# Patient Record
Sex: Male | Born: 1985 | Race: Black or African American | Hispanic: No | Marital: Married | Smoking: Never smoker
Health system: Southern US, Community
[De-identification: ages and names within clinical notes are randomized; demographics above are authoritative.]

---

## 2019-01-09 ENCOUNTER — Other Ambulatory Visit: Payer: Self-pay | Admitting: *Deleted

## 2019-01-09 DIAGNOSIS — Z20822 Contact with and (suspected) exposure to covid-19: Secondary | ICD-10-CM

## 2019-01-10 LAB — NOVEL CORONAVIRUS, NAA: SARS-CoV-2, NAA: NOT DETECTED

## 2019-01-11 ENCOUNTER — Telehealth: Payer: Self-pay | Admitting: General Practice

## 2019-01-11 NOTE — Telephone Encounter (Signed)
Negative COVID results given. Patient results "NOT Detected." Caller expressed understanding. ° °

## 2019-05-22 ENCOUNTER — Other Ambulatory Visit: Payer: Self-pay

## 2019-05-22 ENCOUNTER — Ambulatory Visit: Payer: Self-pay | Attending: Internal Medicine

## 2019-05-22 DIAGNOSIS — Z20822 Contact with and (suspected) exposure to covid-19: Secondary | ICD-10-CM | POA: Insufficient documentation

## 2019-05-23 LAB — NOVEL CORONAVIRUS, NAA: SARS-CoV-2, NAA: NOT DETECTED

## 2019-07-07 ENCOUNTER — Encounter (HOSPITAL_BASED_OUTPATIENT_CLINIC_OR_DEPARTMENT_OTHER): Payer: Self-pay | Admitting: Emergency Medicine

## 2019-07-07 ENCOUNTER — Emergency Department (HOSPITAL_BASED_OUTPATIENT_CLINIC_OR_DEPARTMENT_OTHER): Payer: No Typology Code available for payment source

## 2019-07-07 ENCOUNTER — Other Ambulatory Visit: Payer: Self-pay

## 2019-07-07 ENCOUNTER — Emergency Department (HOSPITAL_BASED_OUTPATIENT_CLINIC_OR_DEPARTMENT_OTHER)
Admission: EM | Admit: 2019-07-07 | Discharge: 2019-07-07 | Disposition: A | Discharge status: Home / Self Care | Payer: No Typology Code available for payment source | Attending: Emergency Medicine | Admitting: Emergency Medicine

## 2019-07-07 DIAGNOSIS — R0781 Pleurodynia: Secondary | ICD-10-CM | POA: Diagnosis not present

## 2019-07-07 DIAGNOSIS — Y998 Other external cause status: Secondary | ICD-10-CM | POA: Insufficient documentation

## 2019-07-07 DIAGNOSIS — Y9389 Activity, other specified: Secondary | ICD-10-CM | POA: Insufficient documentation

## 2019-07-07 DIAGNOSIS — Y92410 Unspecified street and highway as the place of occurrence of the external cause: Secondary | ICD-10-CM | POA: Diagnosis not present

## 2019-07-07 DIAGNOSIS — M25511 Pain in right shoulder: Secondary | ICD-10-CM | POA: Insufficient documentation

## 2019-07-07 MED ORDER — METHOCARBAMOL 500 MG PO TABS: 500.0000 mg | ORAL_TABLET | Freq: Two times a day (BID) | ORAL | 0 refills | Status: DC

## 2019-07-07 MED ORDER — LIDOCAINE 5 % EX PTCH: 1.0000 | MEDICATED_PATCH | CUTANEOUS | 0 refills | Status: DC

## 2019-07-07 NOTE — ED Notes (Signed)
PA at bedside.

## 2019-07-07 NOTE — ED Notes (Signed)
Wife at bedside.
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
## 2019-07-07 NOTE — Discharge Instructions (Signed)
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
## 2019-12-30 ENCOUNTER — Ambulatory Visit
Admission: RE | Admit: 2019-12-30 | Discharge: 2019-12-30 | Disposition: A | Discharge status: Home / Self Care | Payer: Self-pay | Source: Ambulatory Visit | Attending: Emergency Medicine | Admitting: Emergency Medicine

## 2019-12-30 ENCOUNTER — Other Ambulatory Visit: Payer: Self-pay

## 2019-12-30 VITALS — BP 121/84 | HR 73 | Temp 98.1°F | Resp 18

## 2019-12-30 DIAGNOSIS — J029 Acute pharyngitis, unspecified: Secondary | ICD-10-CM | POA: Insufficient documentation

## 2019-12-30 DIAGNOSIS — Z20822 Contact with and (suspected) exposure to covid-19: Secondary | ICD-10-CM | POA: Insufficient documentation

## 2019-12-30 DIAGNOSIS — Z20818 Contact with and (suspected) exposure to other bacterial communicable diseases: Secondary | ICD-10-CM | POA: Insufficient documentation

## 2019-12-30 DIAGNOSIS — Z1152 Encounter for screening for COVID-19: Secondary | ICD-10-CM | POA: Diagnosis not present

## 2019-12-30 LAB — POCT RAPID STREP A (OFFICE): Rapid Strep A Screen: NEGATIVE

## 2019-12-30 MED ORDER — AMOXICILLIN 500 MG PO CAPS: 500.0000 mg | ORAL_CAPSULE | Freq: Two times a day (BID) | ORAL | 0 refills | Status: AC

## 2019-12-30 NOTE — Discharge Instructions (Signed)
Strep test negative Culture sent.  We will follow up with you regarding abnormal results COVID testing ordered.  It will take between 5-7 days for test results.  Someone will contact you regarding abnormal results.    In the meantime: You should remain isolated in your home for 10 days from symptom onset AND greater than 72 hours after symptoms resolution (absence of fever without the use of fever-reducing medication and improvement in respiratory symptoms), whichever is longer Get plenty of rest and push fluids Use OTC zyrtec for nasal congestion, runny nose, and/or sore throat Use OTC flonase for nasal congestion and runny nose Use medications daily for symptom relief Use OTC medications like ibuprofen or tylenol as needed fever or pain Call or go to the ED if you have any new or worsening symptoms such as fever, cough, shortness of breath, chest tightness, chest pain, turning blue, changes in mental status, etc..Marland Kitchen

## 2019-12-30 NOTE — ED Triage Notes (Signed)
Pt presents with scratchy throat for past few days , daughter both being treated for strep

## 2019-12-30 NOTE — ED Provider Notes (Signed)
[facility]   [id number] 12/30/19 Arrival Time: 1048   CC: sore throat; strep exposure  SUBJECTIVE: History from: patient.  Glenn Graham is a 34 y.o. male who presents with scratchy throat x few days.  Daughter currently being treated for strep.  Has tried warm tea with relief.  Symptoms are made worse with swallowing, but tolerating liquids and own secretions without difficulty.  Reports previous symptoms in the past.   Denies fever, chills, fatigue, sinus pain, rhinorrhea, cough, SOB, wheezing, chest pain, nausea, changes in bowel or bladder habits.     ROS: As per HPI.  All other pertinent ROS negative.     History reviewed. No pertinent past medical history. History reviewed. No pertinent surgical history. No Known Allergies No current facility-administered medications on file prior to encounter.   Current Outpatient Medications on File Prior to Encounter  Medication Sig Dispense Refill  . lidocaine (LIDODERM) 5 % Place 1 patch onto the skin daily. Remove & Discard patch within 12 hours or as directed by MD 30 patch 0   Social History   Socioeconomic History  . Marital status: Married    Spouse name: Not on file  . Number of children: Not on file  . Years of education: Not on file  . Highest education level: Not on file  Occupational History  . Not on file  Tobacco Use  . Smoking status: Never Smoker  . Smokeless tobacco: Never Used  Substance and Sexual Activity  . Alcohol use: Not Currently  . Drug use: Not Currently  . Sexual activity: Not on file  Other Topics Concern  . Not on file  Social History Narrative  . Not on file   Social Determinants of Health   Financial Resource Strain:   . Difficulty of Paying Living Expenses: Not on file  Food Insecurity:   . Worried About Programme researcher, broadcasting/film/video in the Last Year: Not on file  . Ran Out of Food in the Last Year: Not on file  Transportation Needs:   . Lack of Transportation (Medical): Not on file    . Lack of Transportation (Non-Medical): Not on file  Physical Activity:   . Days of Exercise per Week: Not on file  . Minutes of Exercise per Session: Not on file  Stress:   . Feeling of Stress : Not on file  Social Connections:   . Frequency of Communication with Friends and Family: Not on file  . Frequency of Social Gatherings with Friends and Family: Not on file  . Attends Religious Services: Not on file  . Active Member of Clubs or Organizations: Not on file  . Attends Banker Meetings: Not on file  . Marital Status: Not on file  Intimate Partner Violence:   . Fear of Current or Ex-Partner: Not on file  . Emotionally Abused: Not on file  . Physically Abused: Not on file  . Sexually Abused: Not on file   Family History  Family history unknown: Yes    OBJECTIVE:  Vitals:   12/30/19 1114  BP: 121/84  Pulse: 73  Resp: 18  Temp: 98.1 F (36.7 C)  SpO2: 96%     General appearance: alert; well-appearing, nontoxic; speaking in full sentences and tolerating own secretions HEENT: NCAT; Ears: EACs clear, TMs pearly gray; Eyes: PERRL.  EOM grossly intact.Nose: nares patent without rhinorrhea, Throat: oropharynx clear, tonsils non erythematous or enlarged, uvula midline  Neck: supple without LAD Lungs: unlabored respirations, symmetrical air entry; cough:  absent; no respiratory distress; CTAB Heart: regular rate and rhythm.  Skin: warm and dry Psychological: alert and cooperative; normal mood and affect   LABS:  Results for orders placed or performed during the hospital encounter of 12/30/19 (from the past 24 hour(s))  POCT rapid strep A     Status: None   Collection Time: 12/30/19 11:20 AM  Result Value Ref Range   Rapid Strep A Screen Negative Negative     ASSESSMENT & PLAN:  1. Encounter for screening for COVID-19   2. Sore throat   3. Suspected COVID-19 virus infection     Meds ordered this encounter  Medications  . amoxicillin (AMOXIL) 500 MG  capsule    Sig: Take 1 capsule (500 mg total) by mouth 2 (two) times daily for 10 days.    Dispense:  20 capsule    Refill:  0    Order Specific Question:   Supervising Provider    Answer:   Eustace Moore [[id number]]   Strep test negative Culture sent.  We will follow up with you regarding abnormal results COVID testing ordered.  It will take between 5-7 days for test results.  Someone will contact you regarding abnormal results.    In the meantime: You should remain isolated in your home for 10 days from symptom onset AND greater than 72 hours after symptoms resolution (absence of fever without the use of fever-reducing medication and improvement in respiratory symptoms), whichever is longer Get plenty of rest and push fluids Use OTC zyrtec for nasal congestion, runny nose, and/or sore throat Use OTC flonase for nasal congestion and runny nose Use medications daily for symptom relief Use OTC medications like ibuprofen or tylenol as needed fever or pain Call or go to the ED if you have any new or worsening symptoms such as fever, cough, shortness of breath, chest tightness, chest pain, turning blue, changes in mental status, etc...   Patient requests antibiotic due to daughters having strep  Reviewed expectations re: course of current medical issues. Questions answered. Outlined signs and symptoms indicating need for more acute intervention. Patient verbalized understanding. After Visit Summary given.         Rennis Harding, PA-C 12/30/19 1143

## 2020-01-01 LAB — SARS-COV-2, NAA 2 DAY TAT

## 2020-01-01 LAB — NOVEL CORONAVIRUS, NAA: SARS-CoV-2, NAA: NOT DETECTED

## 2020-01-02 LAB — CULTURE, GROUP A STREP (THRC)

## 2020-07-22 ENCOUNTER — Emergency Department: Admit: 2020-07-22 | Disposition: A | Discharge status: Still In Hospital | Payer: Self-pay

## 2020-07-22 ENCOUNTER — Other Ambulatory Visit: Payer: Self-pay

## 2020-07-22 ENCOUNTER — Emergency Department (INDEPENDENT_AMBULATORY_CARE_PROVIDER_SITE_OTHER)
Admission: EM | Admit: 2020-07-22 | Discharge: 2020-07-22 | Disposition: A | Discharge status: Home / Self Care | Payer: Self-pay | Source: Home / Self Care | Attending: Internal Medicine | Admitting: Internal Medicine

## 2020-07-22 ENCOUNTER — Encounter: Payer: Self-pay | Admitting: Emergency Medicine

## 2020-07-22 DIAGNOSIS — M79601 Pain in right arm: Secondary | ICD-10-CM

## 2020-07-22 MED ORDER — IBUPROFEN 400 MG PO TABS: 400.0000 mg | ORAL_TABLET | Freq: Four times a day (QID) | ORAL | 0 refills | Status: AC | PRN

## 2020-07-22 MED ORDER — TIZANIDINE HCL 2 MG PO TABS: 2.0000 mg | ORAL_TABLET | Freq: Every evening | ORAL | 0 refills | Status: AC | PRN
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
## 2020-07-22 NOTE — Discharge Instructions (Signed)
Into range of motion exercises Take medications as prescribed If symptoms worsen please return to urgent care to be reevaluated.
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
## 2024-02-15 ENCOUNTER — Other Ambulatory Visit (HOSPITAL_COMMUNITY): Payer: Self-pay

## 2024-02-15 MED ORDER — FLUZONE 0.5 ML IM SUSY
0.5000 mL | PREFILLED_SYRINGE | Freq: Once | INTRAMUSCULAR | 0 refills | Status: AC
  Filled 2024-02-15: qty 0.5, 1d supply, fill #0
